# Patient Record
Sex: Male | Born: 1970 | Race: Black or African American | Hispanic: No | Marital: Single | Smoking: Never smoker
Health system: Southern US, Community
[De-identification: ages and names within clinical notes are randomized; demographics above are authoritative.]

## PROBLEM LIST (undated history)

## (undated) DIAGNOSIS — N289 Disorder of kidney and ureter, unspecified: Secondary | ICD-10-CM

## (undated) DIAGNOSIS — G4730 Sleep apnea, unspecified: Secondary | ICD-10-CM

## (undated) DIAGNOSIS — I10 Essential (primary) hypertension: Secondary | ICD-10-CM

## (undated) DIAGNOSIS — G5600 Carpal tunnel syndrome, unspecified upper limb: Secondary | ICD-10-CM

## (undated) HISTORY — PX: HERNIA REPAIR: SHX51

---

## 1997-12-26 ENCOUNTER — Emergency Department (HOSPITAL_COMMUNITY): Admission: EM | Admit: 1997-12-26 | Discharge: 1997-12-26 | Payer: Self-pay | Admitting: Emergency Medicine

## 2002-10-13 ENCOUNTER — Encounter: Payer: Self-pay | Admitting: Emergency Medicine

## 2002-10-13 ENCOUNTER — Emergency Department (HOSPITAL_COMMUNITY): Admission: EM | Admit: 2002-10-13 | Discharge: 2002-10-13 | Payer: Self-pay | Admitting: Emergency Medicine

## 2004-07-15 ENCOUNTER — Emergency Department (HOSPITAL_COMMUNITY): Admission: EM | Admit: 2004-07-15 | Discharge: 2004-07-15 | Payer: Self-pay | Admitting: Emergency Medicine

## 2006-07-30 ENCOUNTER — Emergency Department (HOSPITAL_COMMUNITY): Admission: EM | Admit: 2006-07-30 | Discharge: 2006-07-30 | Payer: Self-pay | Admitting: Emergency Medicine

## 2007-01-01 ENCOUNTER — Emergency Department (HOSPITAL_COMMUNITY): Admission: EM | Admit: 2007-01-01 | Discharge: 2007-01-01 | Payer: Self-pay | Admitting: Emergency Medicine

## 2007-05-17 ENCOUNTER — Emergency Department (HOSPITAL_COMMUNITY): Admission: EM | Admit: 2007-05-17 | Discharge: 2007-05-17 | Payer: Self-pay | Admitting: Emergency Medicine

## 2007-11-20 ENCOUNTER — Emergency Department (HOSPITAL_COMMUNITY): Admission: EM | Admit: 2007-11-20 | Discharge: 2007-11-20 | Payer: Self-pay | Admitting: Emergency Medicine

## 2008-02-29 ENCOUNTER — Emergency Department (HOSPITAL_COMMUNITY): Admission: EM | Admit: 2008-02-29 | Discharge: 2008-03-01 | Payer: Self-pay | Admitting: Emergency Medicine

## 2008-09-02 IMAGING — CT CT PELVIS W/O CM
2 of 4 series · 14 of 32 positions shown, 19 images · non-contrast
Comparison: 05/17/2007

CT ABDOMEN

CLINICAL DATA: RIGHT FLANK PAIN.

CT ABDOMEN AND PELVIS WITHOUT CONTRAST (CT UROGRAM)
TECHNIQUE: Contiguous axial images of the abdomen and pelvis
without oral or intravenous contrast were obtained.

[Series 2: renal stone · axial · 0.78mm/px · z∈[-415,-60]mm · 8 of 93 slices shown, 13 images]
[im 11/93  soft-tissue]
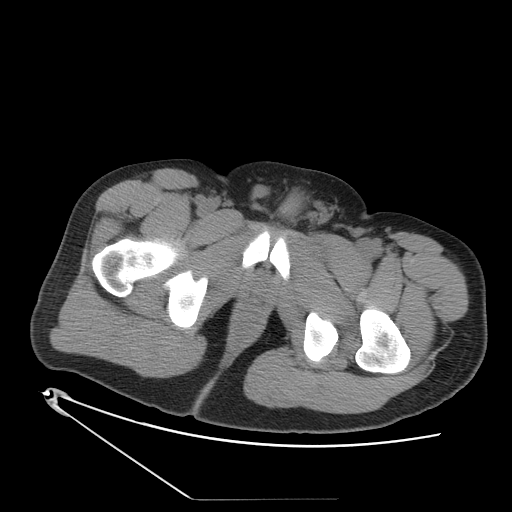
[im 11/93  bone]
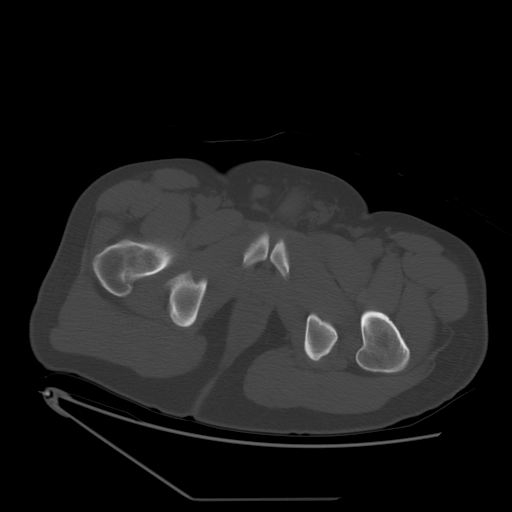
[im 21/93  soft-tissue]
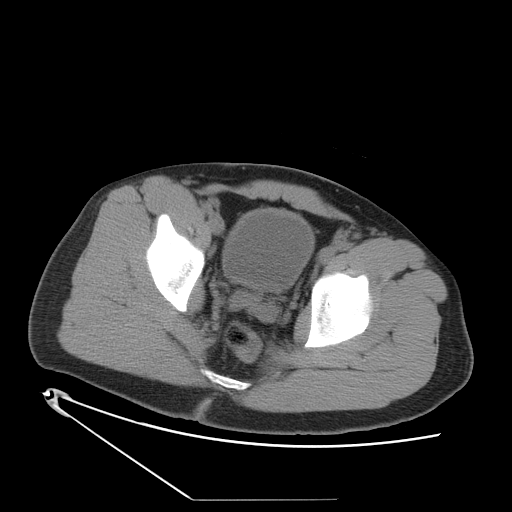
[im 31/93  soft-tissue]
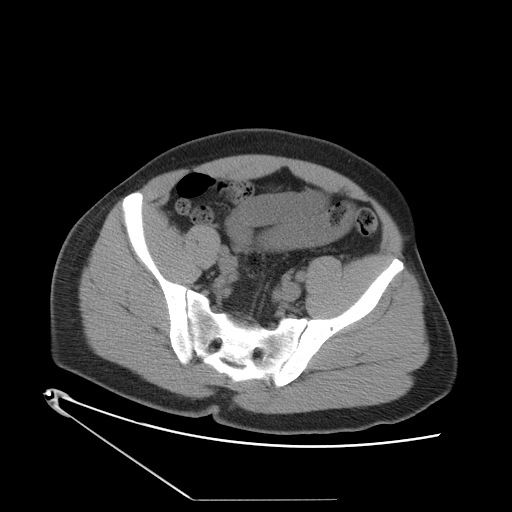
[im 41/93  soft-tissue]
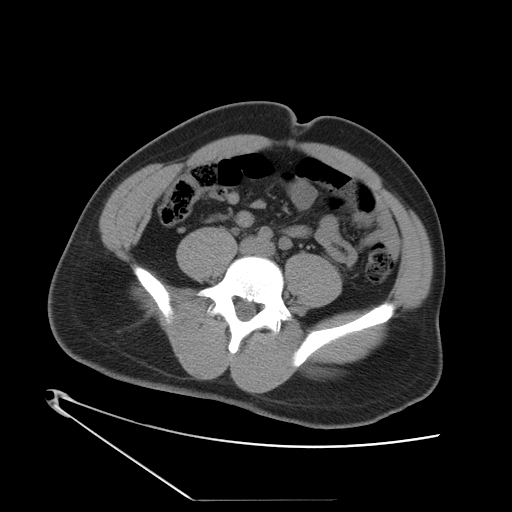
[im 52/93  soft-tissue]
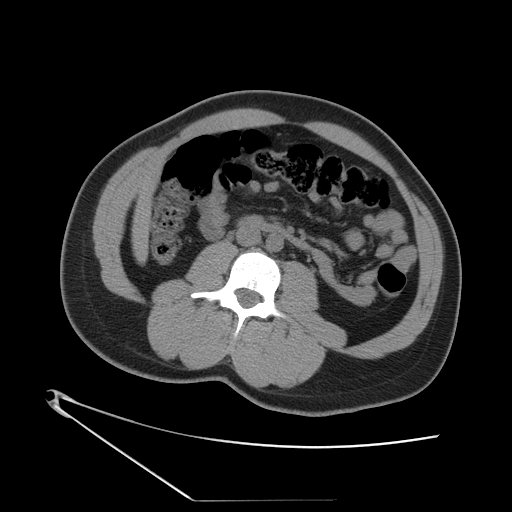
[im 52/93  lung]
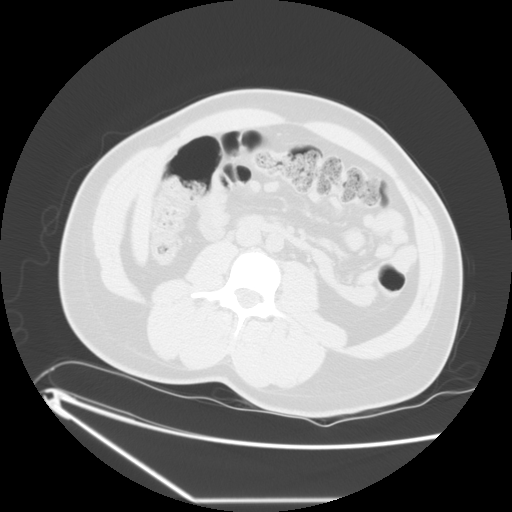
[im 62/93  soft-tissue]
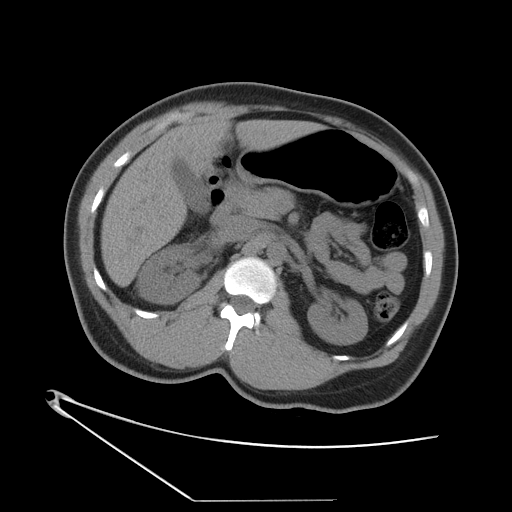
[im 62/93  lung]
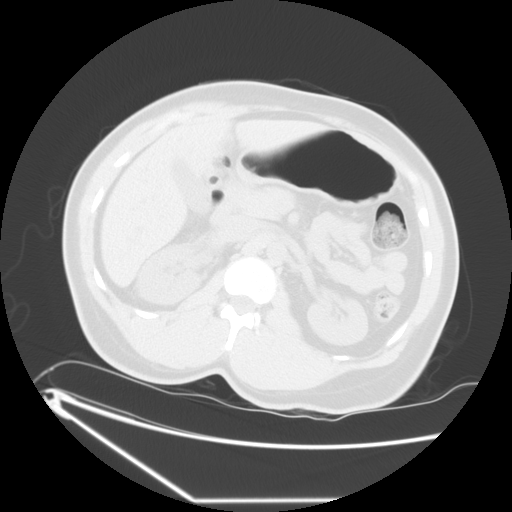
[im 72/93  soft-tissue]
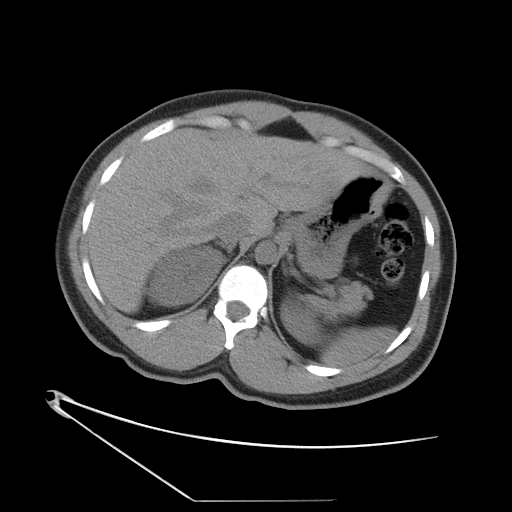
[im 72/93  lung]
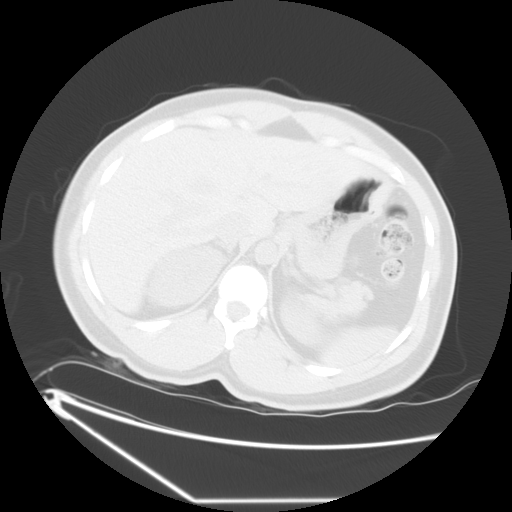
[im 82/93  soft-tissue]
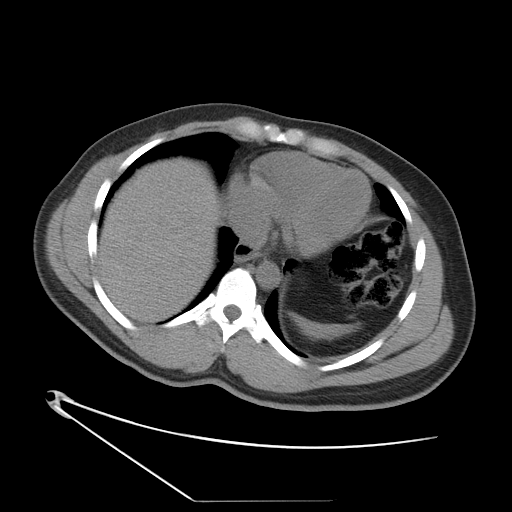
[im 82/93  lung]
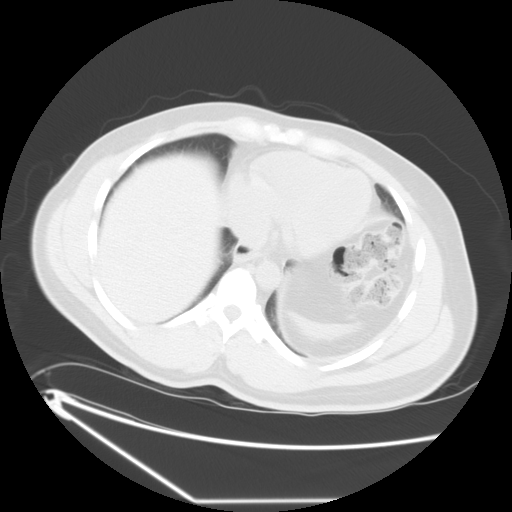

[Series 400: reformatted · sagittal · 0.93mm/px · 6 of 117 slices shown]
[im 12/117  soft-tissue]
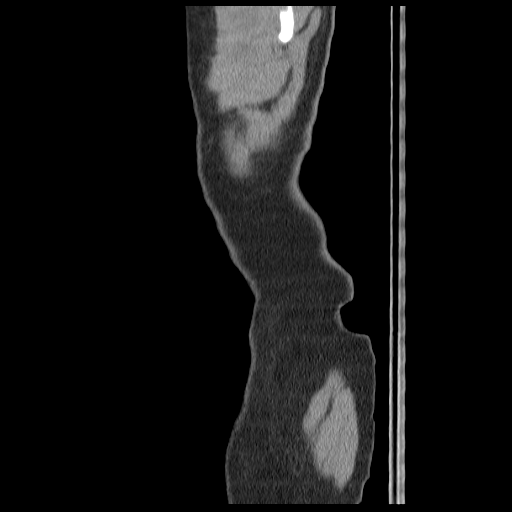
[im 24/117  soft-tissue]
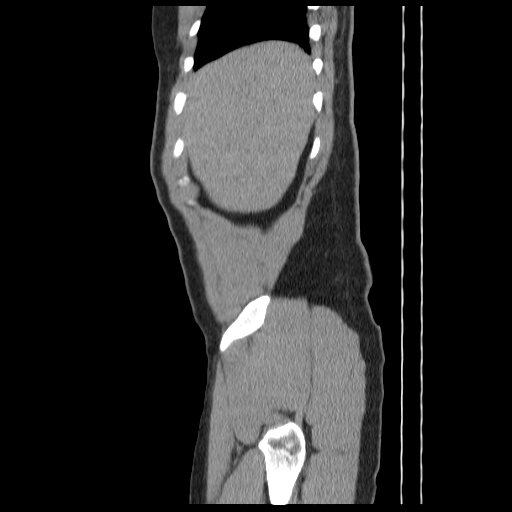
[im 35/117  soft-tissue]
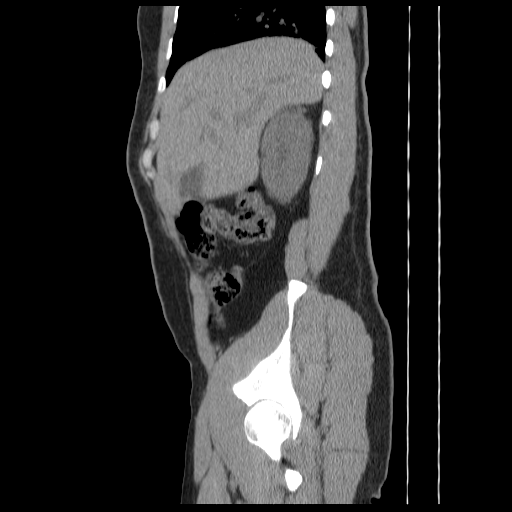
[im 47/117  soft-tissue]
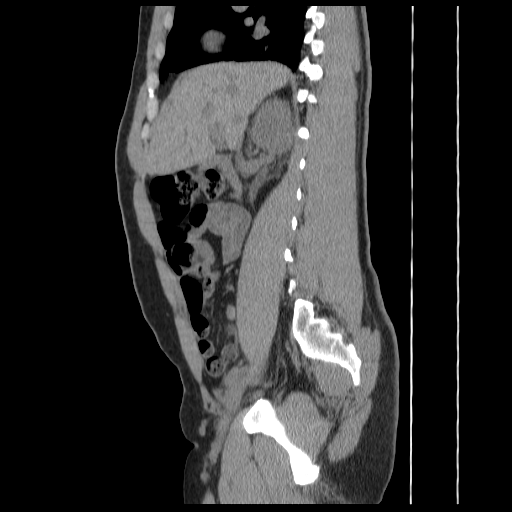
[im 70/117  soft-tissue]
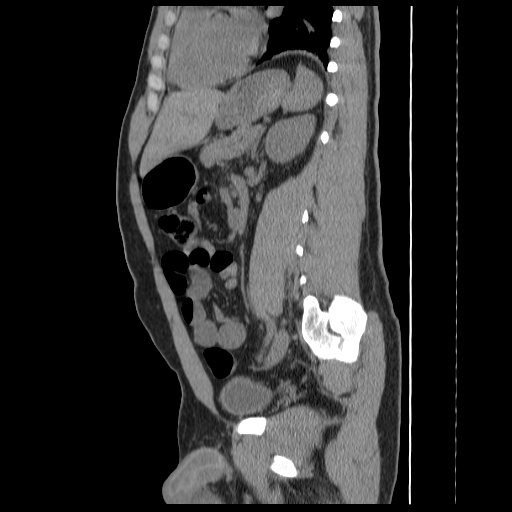
[im 82/117  soft-tissue]
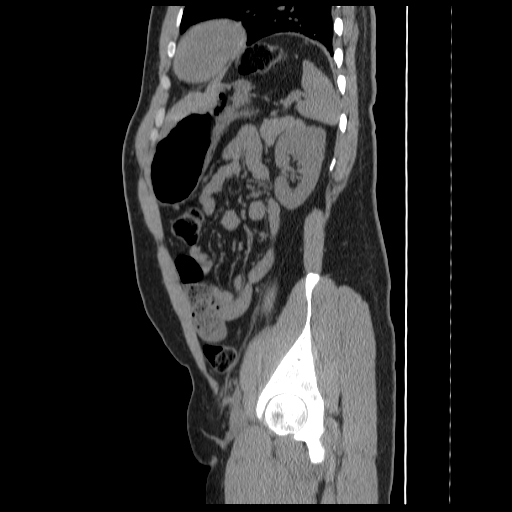

[14 of 32 positions shown; findings below may reference images not displayed]

FINDINGS: Exam is limited for evaluation of entities other than
urinary tract calculi due to lack of oral or intravenous contrast.

 Clear lung bases.  Normal heart size without pericardial or
pleural effusion.

Small hiatal hernia normal liver, spleen, stomach, pancreas,
gallbladder, biliary tract, adrenal glands, left kidney.  Mild to
moderate obstructive signs involve the right kidney.    Right
hydroureter throughout.

No retroperitoneal or retrocrural adenopathy. Normal colon,
appendix, and terminal ileum.

Normal abdominal small bowel without ascites.
IMPRESSION: 1.  Mild to moderate right-sided obstructive signs due to distal
stone to be described below.

CT PELVIS
FINDINGS: Hydroureter to the level of 2 mm right ureterovesicular
junction calculus on axial image 72.  Bowel loops normal. no pelvic
adenopathy.  Normal prostate.

No acute osseous abnormality.
IMPRESSION: 1.  2 mm right UVJ calculus.

## 2010-02-07 ENCOUNTER — Emergency Department (HOSPITAL_COMMUNITY): Admission: EM | Admit: 2010-02-07 | Discharge: 2010-02-07 | Payer: Self-pay | Admitting: Emergency Medicine

## 2010-12-04 LAB — DIFFERENTIAL
Basophils Absolute: 0 10*3/uL (ref 0.0–0.1)
Basophils Relative: 0 % (ref 0–1)
Eosinophils Absolute: 0 10*3/uL (ref 0.0–0.7)
Eosinophils Relative: 0 % (ref 0–5)
Monocytes Absolute: 0.2 10*3/uL (ref 0.1–1.0)
Monocytes Relative: 3 % (ref 3–12)
Neutro Abs: 8.3 10*3/uL — ABNORMAL HIGH (ref 1.7–7.7)

## 2010-12-04 LAB — RAPID STREP SCREEN (MED CTR MEBANE ONLY): Streptococcus, Group A Screen (Direct): NEGATIVE

## 2010-12-04 LAB — BASIC METABOLIC PANEL
CO2: 24 mEq/L (ref 19–32)
Calcium: 8.7 mg/dL (ref 8.4–10.5)
Chloride: 108 mEq/L (ref 96–112)
Glucose, Bld: 117 mg/dL — ABNORMAL HIGH (ref 70–99)
Sodium: 137 mEq/L (ref 135–145)

## 2010-12-04 LAB — CBC
Hemoglobin: 12.4 g/dL — ABNORMAL LOW (ref 13.0–17.0)
MCHC: 32.7 g/dL (ref 30.0–36.0)
MCV: 88.4 fL (ref 78.0–100.0)
RDW: 12.5 % (ref 11.5–15.5)

## 2011-06-14 LAB — URINALYSIS, ROUTINE W REFLEX MICROSCOPIC
Bilirubin Urine: NEGATIVE
Hgb urine dipstick: NEGATIVE
Ketones, ur: NEGATIVE
Nitrite: NEGATIVE
Protein, ur: NEGATIVE
Specific Gravity, Urine: 1.016
Urobilinogen, UA: 1

## 2011-06-29 LAB — URINALYSIS, ROUTINE W REFLEX MICROSCOPIC
Glucose, UA: NEGATIVE
Leukocytes, UA: NEGATIVE
Protein, ur: NEGATIVE
Specific Gravity, Urine: 1.013

## 2011-06-29 LAB — COMPREHENSIVE METABOLIC PANEL
ALT: 19
Albumin: 3.7
Alkaline Phosphatase: 70
BUN: 7
Chloride: 108
Glucose, Bld: 128 — ABNORMAL HIGH
Potassium: 2.8 — ABNORMAL LOW
Sodium: 135
Total Bilirubin: 0.9
Total Protein: 7

## 2011-06-29 LAB — CBC
HCT: 37.9 — ABNORMAL LOW
Hemoglobin: 12.5 — ABNORMAL LOW
MCHC: 33
MCV: 86
Platelets: 237
RBC: 4.41
RDW: 12.9
WBC: 7.8

## 2011-06-29 LAB — DIFFERENTIAL
Basophils Absolute: 0
Basophils Relative: 0
Eosinophils Absolute: 0
Monocytes Absolute: 0.6
Monocytes Relative: 7

## 2011-06-29 LAB — URINE MICROSCOPIC-ADD ON

## 2012-04-25 ENCOUNTER — Emergency Department (HOSPITAL_COMMUNITY)
Admission: EM | Admit: 2012-04-25 | Discharge: 2012-04-25 | Disposition: A | Discharge status: Home / Self Care | Payer: Non-veteran care | Attending: Emergency Medicine | Admitting: Emergency Medicine

## 2012-04-25 ENCOUNTER — Encounter (HOSPITAL_COMMUNITY): Payer: Self-pay | Admitting: Emergency Medicine

## 2012-04-25 DIAGNOSIS — T6391XA Toxic effect of contact with unspecified venomous animal, accidental (unintentional), initial encounter: Secondary | ICD-10-CM | POA: Insufficient documentation

## 2012-04-25 DIAGNOSIS — T63441A Toxic effect of venom of bees, accidental (unintentional), initial encounter: Secondary | ICD-10-CM

## 2012-04-25 DIAGNOSIS — T63461A Toxic effect of venom of wasps, accidental (unintentional), initial encounter: Secondary | ICD-10-CM | POA: Insufficient documentation

## 2012-04-25 MED ORDER — DIPHENHYDRAMINE HCL 25 MG PO CAPS
25.0000 mg | ORAL_CAPSULE | Freq: Once | ORAL | Status: AC
  Administered 2012-04-25: 25 mg via ORAL
  Filled 2012-04-25: qty 1

## 2012-04-25 MED ORDER — OXYCODONE-ACETAMINOPHEN 5-325 MG PO TABS
1.0000 | ORAL_TABLET | Freq: Once | ORAL | Status: AC
  Administered 2012-04-25: 1 via ORAL
  Filled 2012-04-25: qty 1

## 2012-04-25 MED ORDER — NAPROXEN 500 MG PO TABS: 500.0000 mg | ORAL_TABLET | Freq: Two times a day (BID) | ORAL | Status: AC

## 2012-04-25 MED ORDER — DIPHENHYDRAMINE HCL 25 MG PO TABS: 50.0000 mg | ORAL_TABLET | Freq: Three times a day (TID) | ORAL | Status: AC | PRN

## 2012-04-25 NOTE — ED Provider Notes (Signed)
History   This chart was scribed for Antonio Kras, MD by Toya Smothers. The patient was seen in room TR09C/TR09C. Patient's care was started at 1013.  CSN: [id number]  Arrival date & time 04/25/12  1013   First MD Initiated Contact with Patient 04/25/12 1144      Chief Complaint  Patient presents with  . Insect Bite   The history is provided by the patient. No language interpreter was used.    UMAIR Obrien is a 41 y.o. male who presents to the ED complaining of multiple bee stings to the posterior neck and left shoulder onset 2 hours ago. Associated swelling and moderate pain. Pt also complains that he fell onto his R knee while running in the grass. Denies emesis, fever, nausea, and diarrhea.  History reviewed. No pertinent past medical history.  History reviewed. No pertinent past surgical history.  History reviewed. No pertinent family history.  History  Substance Use Topics  . Smoking status: Never Smoker   . Smokeless tobacco: Not on file  . Alcohol Use: No   Review of Systems  Skin: Positive for wound.  All other systems reviewed and are negative.    Allergies  Review of patient's allergies indicates no known allergies.  Home Medications   Current Outpatient Rx  Name Route Sig Dispense Refill  . PRESCRIPTION MEDICATION Oral Take 1 tablet by mouth daily. Cholesterol medication      BP 138/82  Pulse 115  Temp 98.3 F (36.8 C) (Oral)  Resp 24  SpO2 97%  Physical Exam  Nursing note and vitals reviewed. Constitutional: He is oriented to person, place, and time. He appears well-developed and well-nourished. No distress.  HENT:  Head: Normocephalic and atraumatic.  Right Ear: External ear normal.  Left Ear: External ear normal.  Mouth/Throat: No oropharyngeal exudate.       No edema noted, no strider.  Eyes: Conjunctivae are normal. Right eye exhibits no discharge. Left eye exhibits no discharge. No scleral icterus.  Neck: Neck supple. No tracheal  deviation present.  Cardiovascular: Normal rate, regular rhythm and normal heart sounds.   Pulmonary/Chest: Effort normal and breath sounds normal. No stridor. No respiratory distress. He has no wheezes.  Musculoskeletal: He exhibits no edema.  Neurological: He is alert and oriented to person, place, and time. Cranial nerve deficit: no gross deficits.  Skin: Skin is warm and dry. No rash noted.       Few legions left shoulder. No retained stingers noted  Psychiatric: He has a normal mood and affect.    ED Course  Procedures (including critical care time) DIAGNOSTIC STUDIES: Oxygen Saturation is 97% on room air, normal by my interpretation.    COORDINATION OF CARE: 1149- Evaluated Pt. Pt is with acute distress. 1200- Ordered Oxycodone-acetaminophen (PERCOCET/ROXICET) 5-325 MG per tablet 1 tablet) Once. 1200- Ordered diphenhydrAMINE (BENADRYL) capsule 25 mg Once.  Labs Reviewed - No data to display No results found.   1. Bee sting       MDM  The patient is having pain from a bee sting but there is no evidence of a systemic reaction. He does not have any retained stingers as best as I can tell. The patient be treated with pain medications. I recommended ice packs      I personally performed the services described in this documentation, which was scribed in my presence.  The recorded information has been reviewed and considered.    Antonio Kras, MD 04/25/12 [phone]

## 2012-04-25 NOTE — Discharge Instructions (Signed)
Bee, Wasp, or Hornet Sting   Your caregiver has diagnosed you as having an insect sting. An insect sting appears as a red lump in the skin that sometimes has a tiny hole in the center, or it may have a stinger in the center of the wound. The most common stings are from wasps, hornets and bees.   Individuals have different reactions to insect stings.   A normal reaction may cause pain, swelling, and redness around the sting site.   A localized allergic reaction may cause swelling and redness that extends beyond the sting site.   A large local reaction may continue to develop over the next 12 to 36 hours.   On occasion, the reactions can be severe (anaphylactic reaction). An anaphylactic reaction may cause wheezing; difficulty breathing; chest pain; fainting; raised, itchy, red patches on the skin; a sick feeling to your stomach (nausea); vomiting; cramping; or diarrhea. If you have had an anaphylactic reaction to an insect sting in the past, you are more likely to have one again.   HOME CARE INSTRUCTIONS   With bee stings, a small sac of poison is left in the wound. Brushing across this with something such as a credit card, or anything similar, will help remove this and decrease the amount of the reaction. This same procedure will not help a wasp sting as they do not leave behind a stinger and poison sac.   Apply a cold compress for 10 to 20 minutes every hour for 1 to 2 days, depending on severity, to reduce swelling and itching.   To lessen pain, a paste made of water and baking soda may be rubbed on the bite or sting and left on for 5 minutes.   To relieve itching and swelling, you may use take medication or apply medicated creams or lotions as directed.   Only take over-the-counter or prescription medicines for pain, discomfort, or fever as directed by your caregiver.   Wash the sting site daily with soap and water. Apply antibiotic ointment on the sting site as directed.   If you suffered a severe reaction:   If  you did not require hospitalization, an adult will need to stay with you for 24 hours in case the symptoms return.   You may need to wear a medical bracelet or necklace stating the allergy.   You and your family need to learn when and how to use an anaphylaxis kit or epinephrine injection.   If you have had a severe reaction before, always carry your anaphylaxis kit with you.   SEEK MEDICAL CARE IF:   None of the above helps within 2 to 3 days.   The area becomes red, warm, tender, and swollen beyond the area of the bite or sting.   You have an oral temperature above 102° F (38.9° C).   SEEK IMMEDIATE MEDICAL CARE IF:   You have symptoms of an allergic reaction which are:   Wheezing.   Difficulty breathing.   Chest pain.   Lightheadedness or fainting.   Itchy, raised, red patches on the skin.   Nausea, vomiting, cramping or diarrhea.   ANY OF THESE SYMPTOMS MAY REPRESENT A SERIOUS PROBLEM THAT IS AN EMERGENCY. Do not wait to see if the symptoms will go away. Get medical help right away. Call your local emergency services (911 in [place] DO NOT drive yourself to the hospital.   MAKE SURE YOU:   Understand these instructions.   Will watch your condition.     Will get help right away if you are not doing well or get worse.   Document Released: 09/03/2005 Document Revised: 08/23/2011 Document Reviewed: 02/18/2010   [employer]® Patient Information ©2012 [employer].

## 2012-04-25 NOTE — ED Notes (Signed)
Pt sts bee sting x 2 today; pt sts never stung before; no distress noted

## 2012-12-08 ENCOUNTER — Emergency Department (HOSPITAL_COMMUNITY)
Admission: EM | Admit: 2012-12-08 | Discharge: 2012-12-08 | Disposition: A | Discharge status: Home / Self Care | Payer: Non-veteran care | Attending: Emergency Medicine | Admitting: Emergency Medicine

## 2012-12-08 ENCOUNTER — Encounter (HOSPITAL_COMMUNITY): Payer: Self-pay

## 2012-12-08 ENCOUNTER — Emergency Department (HOSPITAL_COMMUNITY): Payer: Non-veteran care

## 2012-12-08 DIAGNOSIS — I319 Disease of pericardium, unspecified: Secondary | ICD-10-CM | POA: Insufficient documentation

## 2012-12-08 DIAGNOSIS — Z79899 Other long term (current) drug therapy: Secondary | ICD-10-CM | POA: Insufficient documentation

## 2012-12-08 DIAGNOSIS — R079 Chest pain, unspecified: Secondary | ICD-10-CM | POA: Insufficient documentation

## 2012-12-08 DIAGNOSIS — Z8669 Personal history of other diseases of the nervous system and sense organs: Secondary | ICD-10-CM | POA: Insufficient documentation

## 2012-12-08 DIAGNOSIS — I10 Essential (primary) hypertension: Secondary | ICD-10-CM | POA: Insufficient documentation

## 2012-12-08 DIAGNOSIS — Z8739 Personal history of other diseases of the musculoskeletal system and connective tissue: Secondary | ICD-10-CM | POA: Insufficient documentation

## 2012-12-08 DIAGNOSIS — M79609 Pain in unspecified limb: Secondary | ICD-10-CM | POA: Insufficient documentation

## 2012-12-08 HISTORY — DX: Sleep apnea, unspecified: G47.30

## 2012-12-08 HISTORY — DX: Essential (primary) hypertension: I10

## 2012-12-08 HISTORY — DX: Carpal tunnel syndrome, unspecified upper limb: G56.00

## 2012-12-08 MED ORDER — IBUPROFEN 800 MG PO TABS: 800.0000 mg | ORAL_TABLET | Freq: Three times a day (TID) | ORAL | Status: AC

## 2012-12-08 NOTE — ED Notes (Signed)
Patient transported to X-ray 

## 2012-12-08 NOTE — ED Notes (Signed)
Pt reports he started to work out x 1 month ago, started to have R sided cp, pain in his R axilla and R shoulder blade.  Pt reports pain is worse when he moves his R arm, but denies any pain when someone else moves his R arm.  Pt reports reclining semi-fowlers position is painful.  Pt is NAD.

## 2012-12-08 NOTE — ED Notes (Signed)
Patient reports that he has been lifting weights and is now having right chest, right shoulder and right arm pain and pain worsens with movement.

## 2012-12-08 NOTE — ED Provider Notes (Signed)
Medical screening examination/treatment/procedure(s) were performed by non-physician practitioner and as supervising physician I was immediately available for consultation/collaboration.   Lyanne Co, MD 12/08/12 [phone]

## 2012-12-08 NOTE — ED Provider Notes (Signed)
History     CSN: [id number]  Arrival date & time 12/08/12  1300   First MD Initiated Contact with Patient 12/08/12 1345      Chief Complaint  Patient presents with  . Chest Pain  . Arm Pain    (Consider location/radiation/quality/duration/timing/severity/associated sxs/prior treatment) HPI Comments: This is a 42 year old male, no pertinent past medical history, who presents emergency department with chief complaint of chest pain and arm pain. Patient states that he recently began lifting weights. He states that he's been having pain in his right arm and right chest since he started lifting last Friday. He states that the pain has been worsening. He is tried taking Tylenol with no relief. The pain is worsened with movement. He denies fevers, chills, or cough. Resting and leaning forward makes his pain better, lying down makes his pain worse.  The history is provided by the patient. No language interpreter was used.    Past Medical History  Diagnosis Date  . Sleep apnea   . Hypertension   . Carpal tunnel syndrome     Past Surgical History  Procedure Laterality Date  . Hernia repair      Family History  Problem Relation Age of Onset  . Cancer Mother   . Cancer Father   . Stroke Brother     History  Substance Use Topics  . Smoking status: Never Smoker   . Smokeless tobacco: Never Used  . Alcohol Use: Yes     Comment: occasionally      Review of Systems  All other systems reviewed and are negative.    Allergies  Review of patient's allergies indicates no known allergies.  Home Medications   Current Outpatient Rx  Name  Route  Sig  Dispense  Refill  . EXPIRED: diphenhydrAMINE (BENADRYL) 25 MG tablet   Oral   Take 2 tablets (50 mg total) by mouth every 8 (eight) hours as needed for itching.   21 tablet   0   . naproxen (NAPROSYN) 500 MG tablet   Oral   Take 1 tablet (500 mg total) by mouth 2 (two) times daily.   30 tablet   0   . PRESCRIPTION  MEDICATION   Oral   Take 1 tablet by mouth daily. Cholesterol medication           BP 140/94  Pulse 81  Temp(Src) 98.1 F (36.7 C) (Oral)  Resp 16  SpO2 97%  Physical Exam  Nursing note and vitals reviewed. Constitutional: He is oriented to person, place, and time. He appears well-developed and well-nourished.  HENT:  Head: Normocephalic and atraumatic.  Nose: Nose normal.  Mouth/Throat: Oropharynx is clear and moist. No oropharyngeal exudate.  Eyes: Conjunctivae and EOM are normal. Pupils are equal, round, and reactive to light. Right eye exhibits no discharge. Left eye exhibits no discharge. No scleral icterus.  Neck: Normal range of motion. Neck supple. No JVD present.  Cardiovascular: Normal rate, regular rhythm, normal heart sounds and intact distal pulses.  Exam reveals no gallop and no friction rub.   No murmur heard. Pulmonary/Chest: Effort normal and breath sounds normal. No respiratory distress. He has no wheezes. He has no rales. He exhibits no tenderness.  Chest wall nontender to palpation, pain is reproduced when patient is reclined  Abdominal: Soft. Bowel sounds are normal. He exhibits no distension and no mass. There is no tenderness. There is no rebound and no guarding.  Musculoskeletal: Normal range of motion. He exhibits no edema and  no tenderness.  Neurological: He is alert and oriented to person, place, and time. He has normal reflexes.  CN 3-12 intact  Skin: Skin is warm and dry.  Psychiatric: He has a normal mood and affect. His behavior is normal. Judgment and thought content normal.    ED Course  Procedures (including critical care time) Results for orders placed during the hospital encounter of 02/07/10  RAPID STREP SCREEN      Result Value Range   Streptococcus, Group A Screen (Direct) NEGATIVE  NEGATIVE  BASIC METABOLIC PANEL      Result Value Range   Sodium 137  135 - 145 mEq/L   Potassium 3.9  3.5 - 5.1 mEq/L   Chloride 108  96 - 112 mEq/L    CO2 24  19 - 32 mEq/L   Glucose, Bld 117 (*) 70 - 99 mg/dL   BUN 8  6 - 23 mg/dL   Creatinine, Ser 5.28  0.4 - 1.5 mg/dL   Calcium 8.7  8.4 - 41.3 mg/dL   GFR calc non Af Amer >60  >60 mL/min   GFR calc Af Amer    >60 mL/min   Value: >60            The eGFR has been calculated     using the MDRD equation.     This calculation has not been     validated in all clinical     situations.     eGFR's persistently     <60 mL/min signify     possible Chronic Kidney Disease.  CBC      Result Value Range   WBC 9.3  4.0 - 10.5 K/uL   RBC 4.30  4.22 - 5.81 MIL/uL   Hemoglobin 12.4 (*) 13.0 - 17.0 g/dL   HCT 24.4 (*) 01.0 - 27.2 %   MCV 88.4  78.0 - 100.0 fL   MCHC 32.7  30.0 - 36.0 g/dL   RDW 53.6  64.4 - 03.4 %   Platelets 165  150 - 400 K/uL  DIFFERENTIAL      Result Value Range   Neutrophils Relative 89 (*) 43 - 77 %   Neutro Abs 8.3 (*) 1.7 - 7.7 K/uL   Lymphocytes Relative 8 (*) 12 - 46 %   Lymphs Abs 0.7  0.7 - 4.0 K/uL   Monocytes Relative 3  3 - 12 %   Monocytes Absolute 0.2  0.1 - 1.0 K/uL   Eosinophils Relative 0  0 - 5 %   Eosinophils Absolute 0.0  0.0 - 0.7 K/uL   Basophils Relative 0  0 - 1 %   Basophils Absolute 0.0  0.0 - 0.1 K/uL   Dg Chest 2 View  12/08/2012  *RADIOLOGY REPORT*  Clinical Data: Chest pain  CHEST - 2 VIEW  Comparison: 12/25/2007  Findings: The heart and pulmonary vascularity are within normal limits.  The lungs are clear bilaterally.  No acute bony abnormality is seen.  IMPRESSION: No acute abnormality noted.   Original Report Authenticated By: Alcide Clever, M.D.     ED ECG REPORT  I personally interpreted this EKG   Date: 12/08/2012   Rate: 82  Rhythm: normal sinus rhythm  QRS Axis: normal  Intervals: normal  ST/T Wave abnormalities: nonspecific T wave changes  Conduction Disutrbances:none  Narrative Interpretation:   Old EKG Reviewed: unchanged    1. Chest pain of pericarditis       MDM  42 year old male with chest pain. Patient  recently started a new workout regimen. However, the patient is nontender to palpation, which leads me to believe that this is not delayed onset muscle soreness. Patient's pain worsens when he is reclining. The distribution pattern is characteristic of pericarditis. Will order a EKG for further evaluation. Very suspicious of pericarditis based on patient's presentation and history. Will reevaluate.  3:22 PM PERC negative.  Discussed with Dr. Patria Mane, who tells me that it is possible it could be pericarditis, but treating the pain like delayed onset muscle soreness is acceptable as the treatment for both is NSAIDs.  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  Discussed this with Dr. Patria Mane, who tells me that a short course of NSAIDs should not be a problem.     Roxy Horseman, PA-C 12/08/12 1524

## 2015-06-27 ENCOUNTER — Emergency Department (HOSPITAL_COMMUNITY)
Admission: EM | Admit: 2015-06-27 | Discharge: 2015-06-27 | Disposition: A | Discharge status: Home / Self Care | Payer: Non-veteran care | Attending: Emergency Medicine | Admitting: Emergency Medicine

## 2015-06-27 ENCOUNTER — Emergency Department (HOSPITAL_COMMUNITY): Payer: Non-veteran care

## 2015-06-27 ENCOUNTER — Encounter (HOSPITAL_COMMUNITY): Payer: Self-pay | Admitting: Emergency Medicine

## 2015-06-27 DIAGNOSIS — I10 Essential (primary) hypertension: Secondary | ICD-10-CM | POA: Insufficient documentation

## 2015-06-27 DIAGNOSIS — R1011 Right upper quadrant pain: Secondary | ICD-10-CM | POA: Diagnosis not present

## 2015-06-27 DIAGNOSIS — Z8669 Personal history of other diseases of the nervous system and sense organs: Secondary | ICD-10-CM | POA: Diagnosis not present

## 2015-06-27 DIAGNOSIS — Z791 Long term (current) use of non-steroidal anti-inflammatories (NSAID): Secondary | ICD-10-CM | POA: Diagnosis not present

## 2015-06-27 DIAGNOSIS — Z87448 Personal history of other diseases of urinary system: Secondary | ICD-10-CM | POA: Insufficient documentation

## 2015-06-27 DIAGNOSIS — R109 Unspecified abdominal pain: Secondary | ICD-10-CM | POA: Diagnosis present

## 2015-06-27 DIAGNOSIS — Z79899 Other long term (current) drug therapy: Secondary | ICD-10-CM | POA: Diagnosis not present

## 2015-06-27 HISTORY — DX: Disorder of kidney and ureter, unspecified: N28.9

## 2015-06-27 LAB — HEPATIC FUNCTION PANEL
ALBUMIN: 4.4 g/dL (ref 3.5–5.0)
ALT: 36 U/L (ref 17–63)
AST: 30 U/L (ref 15–41)
Alkaline Phosphatase: 90 U/L (ref 38–126)
Bilirubin, Direct: <0.1 mg/dL — ABNORMAL LOW (ref 0.1–0.5)
Total Bilirubin: 1.1 mg/dL (ref 0.3–1.2)
Total Protein: 8.4 g/dL — ABNORMAL HIGH (ref 6.5–8.1)

## 2015-06-27 LAB — CBC WITH DIFFERENTIAL/PLATELET
Basophils Absolute: 0 10*3/uL (ref 0.0–0.1)
Basophils Relative: 0 %
Eosinophils Absolute: 0.1 10*3/uL (ref 0.0–0.7)
Eosinophils Relative: 1 %
HEMATOCRIT: 41 % (ref 39.0–52.0)
HEMOGLOBIN: 13.2 g/dL (ref 13.0–17.0)
LYMPHS ABS: 1.8 10*3/uL (ref 0.7–4.0)
LYMPHS PCT: 39 %
MCH: 28.6 pg (ref 26.0–34.0)
MCHC: 32.2 g/dL (ref 30.0–36.0)
MCV: 88.7 fL (ref 78.0–100.0)
MONO ABS: 0.3 10*3/uL (ref 0.1–1.0)
MONOS PCT: 6 %
NEUTROS ABS: 2.5 10*3/uL (ref 1.7–7.7)
NEUTROS PCT: 54 %
Platelets: 226 10*3/uL (ref 150–400)
RBC: 4.62 MIL/uL (ref 4.22–5.81)
RDW: 12.6 % (ref 11.5–15.5)
WBC: 4.7 10*3/uL (ref 4.0–10.5)

## 2015-06-27 LAB — URINALYSIS, ROUTINE W REFLEX MICROSCOPIC
Bilirubin Urine: NEGATIVE
GLUCOSE, UA: NEGATIVE mg/dL
Hgb urine dipstick: NEGATIVE
KETONES UR: NEGATIVE mg/dL
LEUKOCYTES UA: NEGATIVE
NITRITE: NEGATIVE
PH: 6 (ref 5.0–8.0)
PROTEIN: NEGATIVE mg/dL
Specific Gravity, Urine: 1.019 (ref 1.005–1.030)
Urobilinogen, UA: 1 mg/dL (ref 0.0–1.0)

## 2015-06-27 LAB — BASIC METABOLIC PANEL
ANION GAP: 5 (ref 5–15)
BUN: 14 mg/dL (ref 6–20)
CALCIUM: 9.5 mg/dL (ref 8.9–10.3)
CO2: 27 mmol/L (ref 22–32)
Chloride: 110 mmol/L (ref 101–111)
Creatinine, Ser: 1.21 mg/dL (ref 0.61–1.24)
GLUCOSE: 98 mg/dL (ref 65–99)
POTASSIUM: 3.7 mmol/L (ref 3.5–5.1)
Sodium: 142 mmol/L (ref 135–145)

## 2015-06-27 MED ORDER — HYDROCODONE-ACETAMINOPHEN 5-325 MG PO TABS: 1.0000 | ORAL_TABLET | ORAL | Status: AC | PRN

## 2015-06-27 MED ORDER — IOHEXOL 300 MG/ML  SOLN
50.0000 mL | Freq: Once | INTRAMUSCULAR | Status: DC | PRN
  Administered 2015-06-27: 50 mL via ORAL
  Filled 2015-06-27: qty 50

## 2015-06-27 MED ORDER — IOHEXOL 300 MG/ML  SOLN
100.0000 mL | Freq: Once | INTRAMUSCULAR | Status: AC | PRN
  Administered 2015-06-27: 100 mL via INTRAVENOUS

## 2015-06-27 NOTE — ED Notes (Signed)
Pt attempting to urinate.

## 2015-06-27 NOTE — ED Notes (Signed)
Per patient states right flank pain for over 2 months-increased pain this weekend

## 2015-06-27 NOTE — ED Provider Notes (Signed)
CSN: [id number]     Arrival date & time 06/27/15  1231 History   First M .D Initiated Contact with Patient 06/27/15 1434     Chief Complaint  Patient presents with  . Flank Pain     (Consider location/radiation/quality/duration/timing/severity/associated sxs/prior Treatment) Patient is a 44 y.o. male presenting with flank pain. The history is provided by the patient.  Flank Pain This is a recurrent problem. Associated symptoms include abdominal pain. Pertinent negatives include no chest pain, no headaches and no shortness of breath.  patient has had right flank/abdominal pain for the last 2 months. His coming on. It is dull. Is worse at times certain positions. No trauma. No dysuria.States he has had somewhat decreased oral intake. No fevers or chills. No cough. No sick contacts. It is dull. It is been worse over the last few days. No fevers or chills. No rash. No difficulty breathing. Past Medical History  Diagnosis Date  . Sleep apnea   . Hypertension   . Carpal tunnel syndrome   . Renal disorder    Past Surgical History  Procedure Laterality Date  . Hernia repair     Family History  Problem Relation Age of Onset  . Cancer Mother   . Cancer Father   . Stroke Brother    Social History  Substance Use Topics  . Smoking status: Never Smoker   . Smokeless tobacco: Never Used  . Alcohol Use: Yes     Comment: occasionally    Review of Systems  Constitutional: Negative for activity change and appetite change.  Eyes: Negative for pain.  Respiratory: Negative for chest tightness and shortness of breath.   Cardiovascular: Negative for chest pain and leg swelling.  Gastrointestinal: Positive for abdominal pain. Negative for nausea, vomiting and diarrhea.  Genitourinary: Positive for flank pain.  Musculoskeletal: Negative for back pain and neck stiffness.  Skin: Negative for rash.  Neurological: Negative for weakness, numbness and headaches.  Psychiatric/Behavioral: Negative  for behavioral problems.      Allergies  Review of patient's allergies indicates no known allergies.  Home Medications   Prior to Admission medications   Medication Sig Start Date End Date Taking? Authorizing Provider  Cyanocobalamin (VITAMIN B-12 PO) Take 1 tablet by mouth daily.   Yes Historical Provider, MD  naproxen (NAPROSYN) 375 MG tablet Take 750 mg by mouth daily.   Yes Historical Provider, MD  polyvinyl alcohol (LIQUIFILM TEARS) 1.4 % ophthalmic solution Place 1 drop into both eyes daily as needed for dry eyes.   Yes Historical Provider, MD  diphenhydrAMINE (BENADRYL) 25 MG tablet Take 2 tablets (50 mg total) by mouth every 8 (eight) hours as needed for itching. 04/25/12 05/25/12  Linwood Dibbles, MD  ibuprofen (ADVIL,MOTRIN) 800 MG tablet Take 1 tablet (800 mg total) by mouth 3 (three) times daily. Patient not taking: Reported on 06/27/2015 12/08/12   Roxy Horseman, PA-C  PRESCRIPTION MEDICATION Take 1 tablet by mouth daily. Cholesterol medication    Historical Provider, MD   BP 162/93 mmHg  Pulse 83  Temp(Src) 98.5 F (36.9 C) (Oral)  Resp 18  SpO2 96% Physical Exam  Constitutional: He is oriented to person, place, and time. He appears well-developed and well-nourished.  HENT:  Head: Normocephalic and atraumatic.  Eyes: EOM are normal. Pupils are equal, round, and reactive to light.  Neck: Normal range of motion. Neck supple.  Cardiovascular: Normal rate, regular rhythm and normal heart sounds.   No murmur heard. Pulmonary/Chest: Effort normal and breath sounds normal.  Abdominal: Soft. Bowel sounds are normal. He exhibits no distension and no mass. There is tenderness. There is no rebound and no guarding.  Mild right upper quadrant to mid abdominal tenderness. No CVA tenderness.  Musculoskeletal: Normal range of motion. He exhibits no edema.  Neurological: He is alert and oriented to person, place, and time. No cranial nerve deficit.  Skin: Skin is warm and dry.  Nursing  note and vitals reviewed.   ED Course  Procedures (including critical care time) Labs Review Labs Reviewed  HEPATIC FUNCTION PANEL - Abnormal; Notable for the following:    Total Protein 8.4 (*)    Bilirubin, Direct <0.1 (*)    All other components within normal limits  URINALYSIS, ROUTINE W REFLEX MICROSCOPIC (NOT AT [facility])  BASIC METABOLIC PANEL  CBC WITH DIFFERENTIAL/PLATELET    Imaging Review No results found. I have personally reviewed and evaluated these images and lab results as part of my medical decision-making.   EKG Interpretation None      MDM   Final diagnoses:  Abdominal pain, unspecified abdominal location    Patient with abdominal/flank pain. Lab work overall reassuring. Tenderness on right abdomen. Will get CT scan.    Benjiman Core, MD 06/27/15 [phone]

## 2015-06-27 NOTE — ED Provider Notes (Signed)
I assumed care for the patient from Dr. Rubin Payor. At that time we were awaiting results of CT scan. CT does not show any acute abnormality. I reviewed the patient's history with him. He has had several months of waxing and waning right sided flank and abdominal pain. His examination is nonsurgical and does not reproduce any significant amount tenderness. At this time I did discuss the possibility of biliary colic and sludge with the patient. He has a follow-up with his physician at the [place] on Thursday. At that time he will get recheck and they will discuss further workup.  Arby Barrette, MD 06/27/15 [phone]

## 2015-06-27 NOTE — Discharge Instructions (Signed)
Abdominal Pain, Adult  You had a CT abdomen done in the emergency department. The cause of your pain was not found on the CT scan. Sometimes people have problems with her gallbladder even though the test is normal. Another test that can check the gallbladder for its function is a HIDA scan. Discussed this and your pain with your doctor when you are rechecked on Thursday.   Many things can cause abdominal pain. Usually, abdominal pain is not caused by a disease and will improve without treatment. It can often be observed and treated at home. Your health care provider will do a physical exam and possibly order blood tests and X-rays to help determine the seriousness of your pain. However, in many cases, more time must pass before a clear cause of the pain can be found. Before that point, your health care provider may not know if you need more testing or further treatment. HOME CARE INSTRUCTIONS Monitor your abdominal pain for any changes. The following actions may help to alleviate any discomfort you are experiencing:  Only take over-the-counter or prescription medicines as directed by your health care provider.  Do not take laxatives unless directed to do so by your health care provider.  Try a clear liquid diet (broth, tea, or water) as directed by your health care provider. Slowly move to a bland diet as tolerated. SEEK MEDICAL CARE IF:  You have unexplained abdominal pain.  You have abdominal pain associated with nausea or diarrhea.  You have pain when you urinate or have a bowel movement.  You experience abdominal pain that wakes you in the night.  You have abdominal pain that is worsened or improved by eating food.  You have abdominal pain that is worsened with eating fatty foods.  You have a fever. SEEK IMMEDIATE MEDICAL CARE IF:  Your pain does not go away within 2 hours.  You keep throwing up (vomiting).  Your pain is felt only in portions of the abdomen, such as the right  side or the left lower portion of the abdomen.  You pass bloody or black tarry stools. MAKE SURE YOU:  Understand these instructions.  Will watch your condition.  Will get help right away if you are not doing well or get worse.   This information is not intended to replace advice given to you by your health care provider. Make sure you discuss any questions you have with your health care provider.   POSSIBLE Biliary Colic Biliary colic is a pain in the upper abdomen. The pain:  Is usually felt on the right side of the abdomen, but it may also be felt in the center of the abdomen, just below the breastbone (sternum).  May spread back toward the right shoulder blade.  May be steady or irregular.  May be accompanied by nausea and vomiting. Most of the time, the pain goes away in 1-5 hours. After the most intense pain passes, the abdomen may continue to ache mildly for about 24 hours. Biliary colic is caused by a blockage in the bile duct. The bile duct is a pathway that carries bile--a liquid that helps to digest fats--from the gallbladder to the small intestine. Biliary colic usually occurs after eating, when the digestive system demands bile. The pain develops when muscle cells contract forcefully to try to move the blockage so that bile can get by. HOME CARE INSTRUCTIONS  Take medicines only as directed by your health care provider.  Drink enough fluid to keep your urine  clear or pale yellow.  Avoid fatty, greasy, and fried foods. These kinds of foods increase your body's demand for bile.  Avoid any foods that make your pain worse.  Avoid overeating.  Avoid having a large meal after fasting. SEEK MEDICAL CARE IF:  You develop a fever.  Your pain gets worse.  You vomit.  You develop nausea that prevents you from eating and drinking. SEEK IMMEDIATE MEDICAL CARE IF:  You suddenly develop a fever and shaking chills.  You develop a yellowish discoloration (jaundice)  of:  Skin.  Whites of the eyes.  Mucous membranes.  You have continuous or severe pain that is not relieved with medicines.  You have nausea and vomiting that is not relieved with medicines.  You develop dizziness or you faint.   This information is not intended to replace advice given to you by your health care provider. Make sure you discuss any questions you have with your health care provider.   Document Released: 02/04/2006 Document Revised: 01/18/2015 Document Reviewed: 06/15/2014 Elsevier Interactive Patient Education [employer]

## 2015-06-27 NOTE — ED Notes (Signed)
Nurse drawing labs. 

## 2015-06-27 NOTE — Progress Notes (Signed)
EDCM spoke to patient at bedside.  Patient confirms he is seen at the [facility] medical center in [place], but cannot tell [facility] the name of his pcp.  Patient reports he has an appointment there on Thursday.  System updated.

## 2018-01-11 ENCOUNTER — Encounter (HOSPITAL_COMMUNITY): Payer: Self-pay | Admitting: Emergency Medicine

## 2018-01-11 ENCOUNTER — Ambulatory Visit (HOSPITAL_COMMUNITY)
Admission: EM | Admit: 2018-01-11 | Discharge: 2018-01-11 | Disposition: A | Discharge status: Home / Self Care | Payer: 59 | Attending: Family Medicine | Admitting: Family Medicine

## 2018-01-11 DIAGNOSIS — L03011 Cellulitis of right finger: Secondary | ICD-10-CM

## 2018-01-11 MED ORDER — SULFAMETHOXAZOLE-TRIMETHOPRIM 800-160 MG PO TABS: 1.0000 | ORAL_TABLET | Freq: Two times a day (BID) | ORAL | 0 refills | Status: AC

## 2018-01-11 NOTE — ED Triage Notes (Signed)
Pt woke up Monday with a swollen R thumb. Denies injury.

## 2018-01-20 NOTE — ED Provider Notes (Signed)
[facility]   [id number] 01/11/18 Arrival Time: 1858  ASSESSMENT & PLAN:  1. Paronychia of right thumb     Meds ordered this encounter  Medications  . sulfamethoxazole-trimethoprim (BACTRIM DS,SEPTRA DS) 800-160 MG tablet    Sig: Take 1 tablet by mouth 2 (two) times daily for 7 days.    Dispense:  14 tablet    Refill:  0    Procedure: Verbal consent obtained. Area over induration cleaned with betadine. No local anesthesia. The most fluctuant portion of the abscess was quyicly incised with a #11 blade scalpel. Purulent drainage expressed. Minimal bleeding. Bandaged. No complications.  Wound care instructions discussed and given in written format. To return in 48 hours for wound check if needed.  Finish all antibiotics. OTC analgesics as needed.  Reviewed expectations re: course of current medical issues. Questions answered. Outlined signs and symptoms indicating need for more acute intervention. Patient verbalized understanding. After Visit Summary given.   SUBJECTIVE:  Antonio Obrien is a 47 y.o. male who presents possible infection around R thumbnail. Gradual onset starting a couple of days ago. Now more painful. Afebrile. No injury/trauma reported. Some swelling. Hurts to touch. No specific aggravating or alleviating factors reported. No OTC treatment.  ROS: As per HPI.  OBJECTIVE:  Vitals:   01/11/18 1915  BP: (!) 170/110  Pulse: 85  Resp: 18  Temp: 98.5 F (36.9 C)  SpO2: 100%     General appearance: alert; no distress Skin: paronychia around lateral R thumbnail skin fold Psychological: alert and cooperative; normal mood and affect  No Known Allergies  Past Medical History:  Diagnosis Date  . Carpal tunnel syndrome   . Hypertension   . Renal disorder   . Sleep apnea    Social History   Socioeconomic History  . Marital status: Single    Spouse name: Not on file  . Number of children: Not on file  . Years of education: Not on file   . Highest education level: Not on file  Occupational History  . Not on file  Social Needs  . Financial resource strain: Not on file  . Food insecurity:    Worry: Not on file    Inability: Not on file  . Transportation needs:    Medical: Not on file    Non-medical: Not on file  Tobacco Use  . Smoking status: Never Smoker  . Smokeless tobacco: Never Used  Substance and Sexual Activity  . Alcohol use: Yes    Comment: occasionally  . Drug use: No  . Sexual activity: Not on file  Lifestyle  . Physical activity:    Days per week: Not on file    Minutes per session: Not on file  . Stress: Not on file  Relationships  . Social connections:    Talks on phone: Not on file    Gets together: Not on file    Attends religious service: Not on file    Active member of club or organization: Not on file    Attends meetings of clubs or organizations: Not on file    Relationship status: Not on file  Other Topics Concern  . Not on file  Social History Narrative  . Not on file   Family History  Problem Relation Age of Onset  . Cancer Mother   . Cancer Father   . Stroke Brother    Past Surgical History:  Procedure Laterality Date  . HERNIA REPAIR  Mardella Layman, MD 01/20/18 [phone]

## 2019-12-24 ENCOUNTER — Ambulatory Visit: Payer: Non-veteran care | Attending: Internal Medicine

## 2019-12-24 DIAGNOSIS — Z23 Encounter for immunization: Secondary | ICD-10-CM

## 2019-12-24 NOTE — Progress Notes (Signed)
   Covid-19 Vaccination Clinic  Name:  Antonio Obrien    MRN: [id number] DOB: August 03, 1971  12/24/2019  [name] was observed post Covid-19 immunization for 15 minutes without incident. He was provided with Vaccine Information Sheet and instruction to access the V-Safe system.   [name] was instructed to call 911 with any severe reactions post vaccine: Marland Kitchen Difficulty breathing  . Swelling of face and throat  . A fast heartbeat  . A bad rash all over body  . Dizziness and weakness   Immunizations Administered    Name Date Dose VIS Date Route   Pfizer COVID-19 Vaccine 12/24/2019 10:08 AM 0.3 mL 08/28/2019 Intramuscular   Manufacturer: [employer], [employer]   Lot: [id number]   NDC: [address]-1

## 2020-01-13 ENCOUNTER — Ambulatory Visit: Payer: Non-veteran care | Attending: Internal Medicine

## 2020-01-13 DIAGNOSIS — Z23 Encounter for immunization: Secondary | ICD-10-CM

## 2020-01-13 NOTE — Progress Notes (Signed)
   Covid-19 Vaccination Clinic  Name:  Antonio Obrien    MRN: [id number] DOB: 1971-05-11  01/13/2020  Mr. [name] was observed post Covid-19 immunization for 15 minutes without incident. He was provided with Vaccine Information Sheet and instruction to access the V-Safe system.   Mr. [name] was instructed to call 911 with any severe reactions post vaccine: Marland Kitchen Difficulty breathing  . Swelling of face and throat  . A fast heartbeat  . A bad rash all over body  . Dizziness and weakness   Immunizations Administered    Name Date Dose VIS Date Route   Pfizer COVID-19 Vaccine 01/13/2020  3:40 PM 0.3 mL 11/11/2018 Intramuscular   Manufacturer: [employer], [employer]   Lot: [id number]   NDC: [address]-4
